# Patient Record
Sex: Female | Born: 1960 | Hispanic: Yes | Marital: Married | Smoking: Former smoker
Health system: Southern US, Community
[De-identification: ages and names within clinical notes are randomized; demographics above are authoritative.]

## PROBLEM LIST (undated history)

## (undated) DIAGNOSIS — R634 Abnormal weight loss: Secondary | ICD-10-CM

## (undated) DIAGNOSIS — R51 Headache: Secondary | ICD-10-CM

## (undated) DIAGNOSIS — Z87442 Personal history of urinary calculi: Secondary | ICD-10-CM

## (undated) DIAGNOSIS — R519 Headache, unspecified: Secondary | ICD-10-CM

## (undated) HISTORY — PX: URETER SURGERY: SHX823

---

## 1983-06-08 HISTORY — PX: BLADDER SURGERY: SHX569

## 2010-10-07 ENCOUNTER — Ambulatory Visit: Payer: Self-pay

## 2016-10-08 ENCOUNTER — Other Ambulatory Visit: Payer: Self-pay | Admitting: Internal Medicine

## 2016-10-08 DIAGNOSIS — Z1231 Encounter for screening mammogram for malignant neoplasm of breast: Secondary | ICD-10-CM

## 2016-10-13 ENCOUNTER — Other Ambulatory Visit: Payer: Self-pay | Admitting: Internal Medicine

## 2016-10-13 DIAGNOSIS — R310 Gross hematuria: Secondary | ICD-10-CM

## 2016-10-19 ENCOUNTER — Ambulatory Visit
Admission: RE | Admit: 2016-10-19 | Discharge: 2016-10-19 | Disposition: A | Discharge status: Home / Self Care | Payer: BLUE CROSS/BLUE SHIELD | Source: Ambulatory Visit | Attending: Internal Medicine | Admitting: Internal Medicine

## 2016-10-19 DIAGNOSIS — N1330 Unspecified hydronephrosis: Secondary | ICD-10-CM | POA: Insufficient documentation

## 2016-10-19 DIAGNOSIS — R310 Gross hematuria: Secondary | ICD-10-CM | POA: Diagnosis not present

## 2016-10-21 ENCOUNTER — Ambulatory Visit
Admission: RE | Admit: 2016-10-21 | Discharge: 2016-10-21 | Disposition: A | Discharge status: Home / Self Care | Payer: BLUE CROSS/BLUE SHIELD | Source: Ambulatory Visit | Attending: Internal Medicine | Admitting: Internal Medicine

## 2016-10-21 ENCOUNTER — Other Ambulatory Visit: Payer: Self-pay | Admitting: Internal Medicine

## 2016-10-21 DIAGNOSIS — Z1231 Encounter for screening mammogram for malignant neoplasm of breast: Secondary | ICD-10-CM | POA: Diagnosis not present

## 2016-11-29 ENCOUNTER — Encounter (INDEPENDENT_AMBULATORY_CARE_PROVIDER_SITE_OTHER): Payer: Self-pay | Admitting: Vascular Surgery

## 2016-11-29 ENCOUNTER — Ambulatory Visit (INDEPENDENT_AMBULATORY_CARE_PROVIDER_SITE_OTHER): Payer: BLUE CROSS/BLUE SHIELD | Admitting: Vascular Surgery

## 2016-11-29 DIAGNOSIS — I872 Venous insufficiency (chronic) (peripheral): Secondary | ICD-10-CM

## 2016-11-29 DIAGNOSIS — M79609 Pain in unspecified limb: Secondary | ICD-10-CM | POA: Insufficient documentation

## 2016-11-29 DIAGNOSIS — M79604 Pain in right leg: Secondary | ICD-10-CM | POA: Diagnosis not present

## 2016-11-29 DIAGNOSIS — I83813 Varicose veins of bilateral lower extremities with pain: Secondary | ICD-10-CM

## 2016-11-29 DIAGNOSIS — M79605 Pain in left leg: Secondary | ICD-10-CM | POA: Diagnosis not present

## 2016-11-30 ENCOUNTER — Ambulatory Visit (INDEPENDENT_AMBULATORY_CARE_PROVIDER_SITE_OTHER): Payer: BLUE CROSS/BLUE SHIELD | Admitting: Urology

## 2016-11-30 ENCOUNTER — Encounter: Payer: Self-pay | Admitting: Urology

## 2016-11-30 VITALS — BP 104/65 | HR 75 | Ht 61.0 in | Wt 112.9 lb

## 2016-11-30 DIAGNOSIS — R310 Gross hematuria: Secondary | ICD-10-CM

## 2016-11-30 DIAGNOSIS — N1330 Unspecified hydronephrosis: Secondary | ICD-10-CM | POA: Diagnosis not present

## 2016-11-30 LAB — URINALYSIS, COMPLETE
Glucose, UA: NEGATIVE
Ketones, UA: NEGATIVE
Leukocytes, UA: NEGATIVE
NITRITE UA: NEGATIVE
PH UA: 5.5 (ref 5.0–7.5)
Protein, UA: NEGATIVE
RBC UA: NEGATIVE
SPEC GRAV UA: 1.01 (ref 1.005–1.030)
UUROB: 0.2 mg/dL (ref 0.2–1.0)

## 2016-11-30 NOTE — Progress Notes (Signed)
11/30/2016 8:56 AM   Aimee Young [birth year] [id number]  Referring provider: Glendon Axe, MD [facility] [place], [place] [address]  Chief Complaint  Patient presents with  . Hydronephrosis    HPI: Referral for gross hematuria, suprapubic pain. Patient developed the symptoms May 9 where she complained of abdominal pain and blood in her urine. She also had dysuria. That had persisted for 2-3 days then cleared. She took Aleve and abx. Symptoms resolved. She never saw a stone pass. UA showed 4-10 red cells, 10-50 white cells, many bacteria. Urine culture grew Escherichia coli. She did undergo a renal ultrasound 10/19/2016 which revealed mild right hydronephrosis on the ultrasound although there were bilateral ureteral jets in the post void of 63 mL. I reviewed the images.  Exposures - she smoked for 5 years and then a few years and then stopped. 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   She has occasional nocturia and weak stream. She has a history of kidney stones and what sounds like an open ureterolithotomy in 1985 on the right. She has SP scar.   Modifying factors: There are no other modifying factors  Associated signs and symptoms: There are no other associated signs and symptoms Aggravating and relieving factors: There are no other aggravating or relieving factors Severity: Moderate Duration: Improved  UA today    PMH: No past medical history on file.  Surgical History: Past Surgical History:  Procedure Laterality Date  . BLADDER SURGERY    . URETER SURGERY Right     Home Medications:  Allergies as of 11/30/2016   No Known Allergies     Medication List       Accurate as of 11/30/16  8:56 AM. Always use your most recent med list.          BRAIN MIGHT/DHA & CO Q10 PO Take by mouth.   collagenase ointment Commonly known as:  SANTYL Apply 1 application topically daily.   HORSE CHESTNUT PO Take by mouth.     KIDNEY Caps Take by mouth.   levofloxacin 250 MG tablet Commonly known as:  LEVAQUIN   loteprednol 0.2 % Susp Commonly known as:  LOTEMAX Administer 1 drop into the left eye Two (2) times a day.   MULTIVITAMIN/FLUORIDE 1-0.3 MG Chew Chew by mouth.   vitamin B-12 100 MCG tablet Commonly known as:  CYANOCOBALAMIN Take 100 mcg by mouth daily.   Vitamin D3 5000 units Caps Take by mouth.       Allergies: No Known Allergies  Family History: Family History  Problem Relation Age of Onset  . Bladder Cancer Neg Hx   . Kidney cancer Neg Hx     Social History:  reports that she has never smoked. She has never used smokeless tobacco. She reports that she does not drink alcohol or use drugs.  ROS: UROLOGY Frequent Urination?: No Hard to postpone urination?: No Burning/pain with urination?: No Get up at night to urinate?: Yes Leakage of urine?: No Urine stream starts and stops?: No Trouble starting stream?: No Do you have to strain to urinate?: No Blood in urine?: Yes Urinary tract infection?: Yes Sexually transmitted disease?: No Injury to kidneys or bladder?: No Painful intercourse?: No Weak stream?: Yes Currently pregnant?: No Vaginal bleeding?: No Last menstrual period?: N  Gastrointestinal Nausea?: No Vomiting?: No Indigestion/heartburn?: Yes Diarrhea?: No Constipation?: No  Constitutional Fever: No Night sweats?: No Weight loss?: Yes Fatigue?: No  Skin Skin rash/lesions?: Yes Itching?:  Yes  Eyes Blurred vision?: Yes Double vision?: No  Ears/Nose/Throat Sore throat?: No Sinus problems?: No  Hematologic/Lymphatic Swollen glands?: No Easy bruising?: No  Cardiovascular Leg swelling?: No Chest pain?: No  Respiratory Cough?: No Shortness of breath?: No  Endocrine Excessive thirst?: No  Musculoskeletal Back pain?: No Joint pain?: Yes  Neurological Headaches?: No Dizziness?: No  Psychologic Depression?: No Anxiety?:  No  Physical Exam: BP 104/65 (BP Location: Left Arm, Patient Position: Sitting, Cuff Size: Normal)   Pulse 75   Ht 5\' 1"  (1.549 m)   Wt 51.2 kg (112 lb 14.4 oz)   BMI 21.33 kg/m   Constitutional:  Alert and oriented, No acute distress. Chaperone: Otto - interpreter  HEENT: [place] AT, moist mucus membranes.  Trachea midline, no masses. Cardiovascular: No clubbing, cyanosis, or edema. Respiratory: Normal respiratory effort, no increased work of breathing. GI: Abdomen is soft, nontender, nondistended, no abdominal masses GU: No CVA tenderness. Skin: No rashes, bruises or suspicious lesions. Lymph: No cervical or inguinal adenopathy. Neurologic: Grossly intact, no focal deficits, moving all 4 extremities. Psychiatric: Normal mood and affect.  Laboratory Data: No results found for: WBC, HGB, HCT, MCV, PLT  No results found for: CREATININE  No results found for: PSA  No results found for: TESTOSTERONE  No results found for: HGBA1C  Urinalysis No results found for: COLORURINE, APPEARANCEUR, LABSPEC, PHURINE, GLUCOSEU, HGBUR, BILIRUBINUR, KETONESUR, PROTEINUR, UROBILINOGEN, NITRITE, LEUKOCYTESUR  Pertinent Imaging: Renal US   Assessment & Plan:    1. Hydronephrosis, unspecified hydronephrosis type - may be related to prior surgery but give gross hematuria and exposure risk needs CT hematuria protocol. - Urinalysis, Complete  2. Gross hematuria - in addition to above - check cystoscopy.    No Follow-up on file.  Festus Aloe, [facility] [address], [place], [place] [address] [phone]

## 2016-12-01 NOTE — Progress Notes (Signed)
MRN : [id number]  Aimee Young is a 56 y.o. (1960-06-11) female who presents with chief complaint of  Chief Complaint  Patient presents with  . New Evaluation    Varicose veins  .  History of Present Illness: The patient is seen for evaluation of symptomatic varicose veins. The patient relates burning and stinging which worsened steadily throughout the course of the day, particularly with standing. The patient also notes an aching and throbbing pain over the varicosities, particularly with prolonged dependent positions. The symptoms are significantly improved with elevation.  The patient also notes that during hot weather the symptoms are greatly intensified. The patient states the pain from the varicose veins interferes with work, daily exercise, shopping and household maintenance. At this point, the symptoms are persistent and severe enough that they're having a negative impact on lifestyle and are interfering with daily activities.  There is no history of DVT, PE or superficial thrombophlebitis. There is no history of ulceration or hemorrhage. The patient denies a significant family history of varicose veins. OB history: P2  The patient has been wearing graduated compression for several years and this has not minimized her pain or prevented the progression of her varicose veins. At the present time the patient has not been using over-the-counter analgesics. There is no history of prior surgical intervention or sclerotherapy.    Current Meds  Medication Sig  . Cholecalciferol (VITAMIN D3) 5000 units CAPS Take by mouth.  . loteprednol (LOTEMAX) 0.2 % SUSP Administer 1 drop into the left eye Two (2) times a day.  Marland Kitchen Multi Vit-Fluoride-Folic Acid (MULTIVITAMIN/FLUORIDE) 1-0.3 MG CHEW Chew by mouth.    No past medical history on file.  Past Surgical History:  Procedure Laterality Date  . BLADDER SURGERY    . URETER SURGERY Right     Social History Social History  Substance Use  Topics  . Smoking status: Never Smoker  . Smokeless tobacco: Never Used  . Alcohol use No    Family History Family History  Problem Relation Age of Onset  . Bladder Cancer Neg Hx   . Kidney cancer Neg Hx   No family history of bleeding/clotting disorders, porphyria or autoimmune disease   No Known Allergies   REVIEW OF SYSTEMS (Negative unless checked)  Constitutional: [] Weight loss  [] Fever  [] Chills Cardiac: [] Chest pain   [] Chest pressure   [] Palpitations   [] Shortness of breath when laying flat   [] Shortness of breath with exertion. Vascular:  [] Pain in legs with walking   [x] Pain in legs with standing  [] History of DVT   [] Phlebitis   [x] Swelling in legs   [x] Varicose veins   [] Non-healing ulcers Pulmonary:   [] Uses home oxygen   [] Productive cough   [] Hemoptysis   [] Wheeze  [] COPD   [] Asthma Neurologic:  [] Dizziness   [] Seizures   [] History of stroke   [] History of TIA  [] Aphasia   [] Vissual changes   [] Weakness or numbness in arm   [] Weakness or numbness in leg Musculoskeletal:   [] Joint swelling   [] Joint pain   [] Low back pain Hematologic:  [] Easy bruising  [] Easy bleeding   [] Hypercoagulable state   [] Anemic Gastrointestinal:  [] Diarrhea   [] Vomiting  [] Gastroesophageal reflux/heartburn   [] Difficulty swallowing. Genitourinary:  [] Chronic kidney disease   [] Difficult urination  [] Frequent urination   [] Blood in urine Skin:  [] Rashes   [] Ulcers  Psychological:  [] History of anxiety   []  History of major depression.  Physical Examination  Vitals:   11/29/16 1523  BP: 113/74  Pulse: 63  Resp: 16  Weight: 52.2 kg (115 lb)  Height: 5\' 1"  (1.549 m)   Body mass index is 21.73 kg/m. Gen: WD/WN, NAD Head: [place]/AT, No temporalis wasting.  Ear/Nose/Throat: Hearing grossly intact, nares w/o erythema or drainage, poor dentition Eyes: PER, EOMI, sclera nonicteric.  Neck: Supple, no masses.  No bruit or JVD.  Pulmonary:  Good air movement, clear to auscultation bilaterally,  no use of accessory muscles.  Cardiac: RRR, normal S1, S2, no Murmurs. Vascular:  varicosities present diffusely greater than 5 mm bilaterally.  Mild venous stasis changes to the legs bilaterally.  Trace soft pitting edema Vessel Right Left  Radial Palpable Palpable  Ulnar Palpable Palpable  Brachial Palpable Palpable  Carotid Palpable Palpable  Femoral Palpable Palpable  Popliteal Palpable Palpable  PT Palpable Palpable  DP Palpable Palpable   Gastrointestinal: soft, non-distended. No guarding/no peritoneal signs.  Musculoskeletal: M/S 5/5 throughout.  No deformity or atrophy.  Neurologic: CN 2-12 intact. Pain and light touch intact in extremities.  Symmetrical.  Speech is fluent. Motor exam as listed above. Psychiatric: Judgment intact, Mood & affect appropriate for pt's clinical situation. Dermatologic: No rashes or ulcers noted.  No changes consistent with cellulitis. Lymph : No Cervical lymphadenopathy, no lichenification or skin changes of chronic lymphedema.  CBC No results found for: WBC, HGB, HCT, MCV, PLT  BMET No results found for: NA, K, CL, CO2, GLUCOSE, BUN, CREATININE, CALCIUM, GFRNONAA, GFRAA CrCl cannot be calculated (No order found.).  COAG No results found for: INR, PROTIME  Radiology No results found.  Assessment/Plan 1. Varicose veins of both lower extremities with pain  Recommend:  The patient has large symptomatic varicose veins that are painful and associated with swelling.  I have had a long discussion with the patient regarding  varicose veins and why they cause symptoms.  Patient will begin wearing graduated compression stockings class 1 on a daily basis, beginning first thing in the morning and removing them in the evening. The patient is instructed specifically not to sleep in the stockings.    The patient  will also begin using over-the-counter analgesics such as Motrin 600 mg po TID to help control the symptoms.    In addition, behavioral  modification including elevation during the day will be initiated.    Pending the results of these changes the  patient will be reevaluated in three months.   An  ultrasound of the venous system will be obtained.   Further plans will be based on the ultrasound results and whether conservative therapies are successful at eliminating the pain and swelling.   - VAS [place] LOWER EXTREMITY VENOUS REFLUX; Future  2. Chronic venous insufficiency See #1  3. Pain in both lower extremities See #1    Hortencia Pilar, MD  12/01/2016 2:09 AM

## 2016-12-15 ENCOUNTER — Ambulatory Visit: Payer: BLUE CROSS/BLUE SHIELD

## 2016-12-21 ENCOUNTER — Other Ambulatory Visit: Payer: BLUE CROSS/BLUE SHIELD | Admitting: Urology

## 2016-12-21 ENCOUNTER — Other Ambulatory Visit: Payer: Self-pay | Admitting: Urology

## 2016-12-21 ENCOUNTER — Telehealth: Payer: Self-pay | Admitting: Urology

## 2016-12-21 NOTE — Telephone Encounter (Signed)
Aimee Young was not seen today. We discussed with her she hadn't had a CT done and the importance of getting the CT given the prior US findings and f/u for exam and cystoscopy. She said she would get the CT done (although it might be in [place]) and f/u after for cystoscopy.

## 2016-12-23 ENCOUNTER — Telehealth: Payer: Self-pay | Admitting: Urology

## 2016-12-23 NOTE — Telephone Encounter (Signed)
Patient cancelled her CT appt due to the cost. She decided that she will go to [place] for treatment as it will be cheaper there.)  Thanks, Sharyn Lull

## 2017-02-28 ENCOUNTER — Encounter (INDEPENDENT_AMBULATORY_CARE_PROVIDER_SITE_OTHER): Payer: BLUE CROSS/BLUE SHIELD

## 2017-02-28 ENCOUNTER — Ambulatory Visit (INDEPENDENT_AMBULATORY_CARE_PROVIDER_SITE_OTHER): Payer: BLUE CROSS/BLUE SHIELD | Admitting: Vascular Surgery

## 2017-04-15 ENCOUNTER — Ambulatory Visit: Payer: BLUE CROSS/BLUE SHIELD | Admitting: Anesthesiology

## 2017-04-15 ENCOUNTER — Encounter
Admission: RE | Disposition: A | Discharge status: Home / Self Care | Payer: Self-pay | Source: Ambulatory Visit | Attending: Unknown Physician Specialty

## 2017-04-15 ENCOUNTER — Ambulatory Visit
Admission: RE | Admit: 2017-04-15 | Discharge: 2017-04-15 | Disposition: A | Discharge status: Home / Self Care | Payer: BLUE CROSS/BLUE SHIELD | Source: Ambulatory Visit | Attending: Unknown Physician Specialty | Admitting: Unknown Physician Specialty

## 2017-04-15 DIAGNOSIS — K640 First degree hemorrhoids: Secondary | ICD-10-CM | POA: Diagnosis not present

## 2017-04-15 DIAGNOSIS — D123 Benign neoplasm of transverse colon: Secondary | ICD-10-CM | POA: Diagnosis not present

## 2017-04-15 DIAGNOSIS — Z87891 Personal history of nicotine dependence: Secondary | ICD-10-CM | POA: Diagnosis not present

## 2017-04-15 DIAGNOSIS — Z79899 Other long term (current) drug therapy: Secondary | ICD-10-CM | POA: Diagnosis not present

## 2017-04-15 DIAGNOSIS — D120 Benign neoplasm of cecum: Secondary | ICD-10-CM | POA: Diagnosis not present

## 2017-04-15 DIAGNOSIS — Z1211 Encounter for screening for malignant neoplasm of colon: Secondary | ICD-10-CM | POA: Diagnosis present

## 2017-04-15 HISTORY — DX: Abnormal weight loss: R63.4

## 2017-04-15 HISTORY — PX: COLONOSCOPY WITH PROPOFOL: SHX5780

## 2017-04-15 SURGERY — COLONOSCOPY WITH PROPOFOL
Anesthesia: General

## 2017-04-15 MED ORDER — EPHEDRINE SULFATE 50 MG/ML IJ SOLN
INTRAMUSCULAR | Status: AC
  Filled 2017-04-15: qty 1

## 2017-04-15 MED ORDER — PROPOFOL 500 MG/50ML IV EMUL
INTRAVENOUS | Status: AC
  Filled 2017-04-15: qty 50

## 2017-04-15 MED ORDER — FENTANYL CITRATE (PF) 100 MCG/2ML IJ SOLN
INTRAMUSCULAR | Status: AC
  Filled 2017-04-15: qty 2

## 2017-04-15 MED ORDER — MIDAZOLAM HCL 2 MG/2ML IJ SOLN
INTRAMUSCULAR | Status: DC | PRN
Start: 2017-04-15 — End: 2017-04-15
  Administered 2017-04-15: 2 mg via INTRAVENOUS

## 2017-04-15 MED ORDER — EPHEDRINE SULFATE 50 MG/ML IJ SOLN
INTRAMUSCULAR | Status: DC | PRN
  Administered 2017-04-15: 10 mg via INTRAVENOUS

## 2017-04-15 MED ORDER — FENTANYL CITRATE (PF) 100 MCG/2ML IJ SOLN
INTRAMUSCULAR | Status: DC | PRN
  Administered 2017-04-15: 50 ug via INTRAVENOUS

## 2017-04-15 MED ORDER — PROPOFOL 500 MG/50ML IV EMUL
INTRAVENOUS | Status: DC | PRN
  Administered 2017-04-15: 120 ug/kg/min via INTRAVENOUS

## 2017-04-15 MED ORDER — SODIUM CHLORIDE 0.9 % IV SOLN
INTRAVENOUS | Status: DC | PRN
  Administered 2017-04-15: 09:00:00 via INTRAVENOUS

## 2017-04-15 MED ORDER — SODIUM CHLORIDE 0.9 % IV SOLN
INTRAVENOUS | Status: DC
  Administered 2017-04-15: 09:00:00 via INTRAVENOUS

## 2017-04-15 MED ORDER — MIDAZOLAM HCL 2 MG/2ML IJ SOLN
INTRAMUSCULAR | Status: AC
  Filled 2017-04-15: qty 2

## 2017-04-15 NOTE — H&P (Signed)
   Primary Care Physician:  Glendon Axe, MD Primary Gastroenterologist:  Dr. Vira Agar  Pre-Procedure History & Physical: HPI:  Aimee Young is a 56 y.o. female is here for an colonoscopy.   Past Medical History:  Diagnosis Date  . Weight loss     Past Surgical History:  Procedure Laterality Date  . BLADDER SURGERY    . URETER SURGERY Right     Prior to Admission medications   Medication Sig Start Date End Date Taking? Authorizing Provider  loteprednol (LOTEMAX) 0.2 % SUSP Administer 1 drop into the left eye Two (2) times a day.   Yes [provider]  Cholecalciferol (VITAMIN D3) 5000 units CAPS Take by mouth.    [provider]  collagenase (SANTYL) ointment Apply 1 application topically daily.    [provider]  HORSE CHESTNUT PO Take by mouth.    [provider]  levofloxacin (LEVAQUIN) 250 MG tablet  10/13/16   [provider]  Multi Vit-Fluoride-Folic Acid (MULTIVITAMIN/FLUORIDE) 1-0.3 MG CHEW Chew by mouth.    [provider]  Nutritional Supplements (KIDNEY) CAPS Take by mouth.    [provider]  Specialty Vitamins Products (BRAIN MIGHT/DHA & CO Q10 PO) Take by mouth.    [provider]  vitamin B-12 (CYANOCOBALAMIN) 100 MCG tablet Take 100 mcg by mouth daily.    [provider]    Allergies as of 01/14/2017  . (No Known Allergies)    Family History  Problem Relation Age of Onset  . Bladder Cancer Neg Hx   . Kidney cancer Neg Hx     Social History   Socioeconomic History  . Marital status: Married    Spouse name: Not on file  . Number of children: Not on file  . Years of education: Not on file  . Highest education level: Not on file  Social Needs  . Financial resource strain: Not on file  . Food insecurity - worry: Not on file  . Food insecurity - inability: Not on file  . Transportation needs - medical: Not on file  . Transportation needs - non-medical: Not on file   Occupational History  . Not on file  Tobacco Use  . Smoking status: Former Research scientist (life sciences)  . Smokeless tobacco: Never Used  Substance and Sexual Activity  . Alcohol use: Yes    Comment: occasional,none last 24hrs  . Drug use: No  . Sexual activity: Not on file  Other Topics Concern  . Not on file  Social History Narrative  . Not on file    Review of Systems: See HPI, otherwise negative ROS  Physical Exam: There were no vitals taken for this visit. General:   Alert,  pleasant and cooperative in NAD Head:  Normocephalic and atraumatic. Neck:  Supple; no masses or thyromegaly. Lungs:  Clear throughout to auscultation.    Heart:  Regular rate and rhythm. Abdomen:  Soft, nontender and nondistended. Normal bowel sounds, without guarding, and without rebound.   Neurologic:  Alert and  oriented x4;  grossly normal neurologically.  Impression/Plan: Aimee Young is here for an colonoscopy to be performed for colon cancer screening.  Risks, benefits, limitations, and alternatives regarding  colonoscopy have been reviewed with the patient.  Questions have been answered.  All parties agreeable.   Gaylyn Cheers, MD  04/15/2017, 8:59 AM

## 2017-04-15 NOTE — Op Note (Signed)
[facility]enter[facility] Patient Name: Aimee Young Procedure Date: 04/15/2017 8:46 AM MRN: [id number] Account #: [id number] Date of Birth: [birth year] Admit Type: Outpatient Age: 56 Room: [facility] ENDO ROOM 1 Gender: Female Note Status: Finalized Procedure:            Colonoscopy Indications:          Screening for colorectal malignant neoplasm Providers:            Manya Silvas, MD Referring MD:         Glendon Axe (Referring MD) Medicines:            Propofol per Anesthesia Complications:        No immediate complications. Procedure:            Pre-Anesthesia Assessment:                       - After reviewing the risks and benefits, the patient                        was deemed in satisfactory condition to undergo the                        procedure.                       After obtaining informed consent, the colonoscope was                        passed under direct vision. Throughout the procedure,                        the patient's blood pressure, pulse, and oxygen                        saturations were monitored continuously. The                        Colonoscope was introduced through the anus and                        advanced to the the cecum, identified by appendiceal                        orifice and ileocecal valve. The colonoscopy was                        performed without difficulty. The patient tolerated the                        procedure well. The quality of the bowel preparation                        was excellent. Findings:      Two sessile polyps were found,one in the hepatic flexure and one in the       cecum. The polyps were diminutive in size. These polyps were removed       with a jumbo cold forceps. Resection and retrieval were complete.      Internal hemorrhoids were found during endoscopy. The hemorrhoids were       small and Grade I (internal hemorrhoids that do not prolapse).  The exam was otherwise without  abnormality. Impression:           - Two diminutive polyps at the hepatic flexure and in                        the cecum, removed with a jumbo cold forceps. Resected                        and retrieved.                       - Internal hemorrhoids. Recommendation:       - Await pathology results. Manya Silvas, MD 04/15/2017 9:40:03 AM This report has been signed electronically. Number of Addenda: 0 Note Initiated On: 04/15/2017 8:46 AM Scope Withdrawal Time: 0 hours 8 minutes 54 seconds  Total Procedure Duration: 0 hours 18 minutes 25 seconds       [facility]

## 2017-04-15 NOTE — Anesthesia Postprocedure Evaluation (Signed)
Anesthesia Post Note  Patient: Designer, multimedia  Procedure(s) Performed: COLONOSCOPY WITH PROPOFOL (N/A )  Patient location during evaluation: Endoscopy Anesthesia Type: General Level of consciousness: awake and alert Pain management: pain level controlled Vital Signs Assessment: post-procedure vital signs reviewed and stable Respiratory status: spontaneous breathing, nonlabored ventilation, respiratory function stable and patient connected to nasal cannula oxygen Cardiovascular status: blood pressure returned to baseline and stable Postop Assessment: no apparent nausea or vomiting Anesthetic complications: no     Last Vitals:  Vitals:   04/15/17 0942 04/15/17 1011  BP: (!) 88/59 111/84  Pulse: 84   Resp: 12   Temp: (!) 36.1 C   SpO2: 99%     Last Pain:  Vitals:   04/15/17 0942  TempSrc: Tympanic                 Martha Clan

## 2017-04-15 NOTE — Anesthesia Post-op Follow-up Note (Signed)
Anesthesia QCDR form completed.        

## 2017-04-15 NOTE — Transfer of Care (Signed)
Immediate Anesthesia Transfer of Care Note  Patient: Aimee Young  Procedure(s) Performed: COLONOSCOPY WITH PROPOFOL (N/A )  Patient Location: PACU  Anesthesia Type:General  Level of Consciousness: awake and sedated  Airway & Oxygen Therapy: Patient Spontanous Breathing and Patient connected to nasal cannula oxygen  Post-op Assessment: Report given to RN and Post -op Vital signs reviewed and stable  Post vital signs: Reviewed and stable  Last Vitals:  Vitals:   04/15/17 0907  BP: 102/69  Pulse: 73  Resp: 20  Temp: (!) 35.8 C  SpO2: 100%    Last Pain:  Vitals:   04/15/17 0907  TempSrc: Tympanic         Complications: No apparent anesthesia complications

## 2017-04-15 NOTE — Anesthesia Procedure Notes (Signed)
Performed by: Cook-Martin, Daleigh Pollinger Pre-anesthesia Checklist: Patient identified, Emergency Drugs available, Suction available, Patient being monitored and Timeout performed Patient Re-evaluated:Patient Re-evaluated prior to induction Oxygen Delivery Method: Nasal cannula Preoxygenation: Pre-oxygenation with 100% oxygen Induction Type: IV induction Placement Confirmation: positive ETCO2 and CO2 detector       

## 2017-04-15 NOTE — Anesthesia Preprocedure Evaluation (Signed)
Anesthesia Evaluation  Patient identified by MRN, date of birth, ID band Patient awake    Reviewed: Allergy & Precautions, H&P , NPO status , Patient's Chart, lab work & pertinent test results, reviewed documented beta blocker date and time   History of Anesthesia Complications Negative for: history of anesthetic complications  Airway Mallampati: I  TM Distance: >3 FB Neck ROM: full    Dental  (+) Caps, Dental Advidsory Given, Missing, Teeth Intact   Pulmonary neg pulmonary ROS, former smoker,           Cardiovascular Exercise Tolerance: Good negative cardio ROS       Neuro/Psych negative neurological ROS  negative psych ROS   GI/Hepatic Neg liver ROS, GERD  ,  Endo/Other  diabetes (Pre-diabetic)  Renal/GU Renal disease (kidney stones)  negative genitourinary   Musculoskeletal   Abdominal   Peds  Hematology negative hematology ROS (+)   Anesthesia Other Findings Past Medical History: No date: Weight loss   Reproductive/Obstetrics negative OB ROS                             Anesthesia Physical Anesthesia Plan  ASA: II  Anesthesia Plan: General   Post-op Pain Management:    Induction: Intravenous  PONV Risk Score and Plan: 3 and Propofol infusion  Airway Management Planned: Nasal Cannula  Additional Equipment:   Intra-op Plan:   Post-operative Plan:   Informed Consent: I have reviewed the patients History and Physical, chart, labs and discussed the procedure including the risks, benefits and alternatives for the proposed anesthesia with the patient or authorized representative who has indicated his/her understanding and acceptance.   Dental Advisory Given  Plan Discussed with: Anesthesiologist, CRNA and Surgeon  Anesthesia Plan Comments:         Anesthesia Quick Evaluation

## 2017-04-18 ENCOUNTER — Encounter: Payer: Self-pay | Admitting: Unknown Physician Specialty

## 2017-04-18 LAB — SURGICAL PATHOLOGY

## 2017-10-12 ENCOUNTER — Other Ambulatory Visit: Payer: Self-pay | Admitting: Internal Medicine

## 2017-10-12 DIAGNOSIS — Z1231 Encounter for screening mammogram for malignant neoplasm of breast: Secondary | ICD-10-CM

## 2017-10-28 ENCOUNTER — Ambulatory Visit
Admission: RE | Admit: 2017-10-28 | Discharge: 2017-10-28 | Disposition: A | Discharge status: Home / Self Care | Payer: 59 | Source: Ambulatory Visit | Attending: Internal Medicine | Admitting: Internal Medicine

## 2017-10-28 ENCOUNTER — Other Ambulatory Visit: Payer: Self-pay | Admitting: Podiatry

## 2017-10-28 DIAGNOSIS — Z1231 Encounter for screening mammogram for malignant neoplasm of breast: Secondary | ICD-10-CM | POA: Diagnosis not present

## 2017-11-02 ENCOUNTER — Other Ambulatory Visit: Payer: Self-pay

## 2017-11-02 ENCOUNTER — Encounter
Admission: RE | Admit: 2017-11-02 | Discharge: 2017-11-02 | Disposition: A | Discharge status: Home / Self Care | Payer: 59 | Source: Ambulatory Visit | Attending: Podiatry | Admitting: Podiatry

## 2017-11-02 DIAGNOSIS — M2011 Hallux valgus (acquired), right foot: Secondary | ICD-10-CM | POA: Diagnosis not present

## 2017-11-02 DIAGNOSIS — M2041 Other hammer toe(s) (acquired), right foot: Secondary | ICD-10-CM | POA: Diagnosis not present

## 2017-11-02 DIAGNOSIS — I739 Peripheral vascular disease, unspecified: Secondary | ICD-10-CM | POA: Diagnosis not present

## 2017-11-02 DIAGNOSIS — Z87891 Personal history of nicotine dependence: Secondary | ICD-10-CM | POA: Diagnosis not present

## 2017-11-02 HISTORY — DX: Headache, unspecified: R51.9

## 2017-11-02 HISTORY — DX: Personal history of urinary calculi: Z87.442

## 2017-11-02 HISTORY — DX: Headache: R51

## 2017-11-02 NOTE — Pre-Procedure Instructions (Signed)
Labs                                                                                                                                                                                                                                                                  Component Name 10/10/2017 10/07/2016   4.9 5.3  4.25 4.37  13.1 13.7  40.5 39.9  95.3 91.3  30.8 31.4 (H)  32.3 34.3  12.9 13.3  10.4 10.2  0.01 0.01  WBC (White Blood Cell Count)  RBC (Red Blood Cell Count)  Hemoglobin  Hematocrit  MCV (Mean Corpuscular Volume)  MCH (Mean Corpuscular Hemoglobin)  MCHC (Mean Corpuscular Hemoglobin Concentration)  RDW-CV (Red Cell Distribution Width)  MPV (Mean Platelet Volume)  Immature Granulocyte Count   CBC w/auto Differential (5 Part) Component Name 10/10/2017 10/07/2016   178 188   0.2  Platelet Count  Immature Granulocyte %   CHEM PROFILE Component Name 10/10/2017 10/07/2016   87 89                                   141 142  3.8 3.8  106 105  28.9 26.0  15 14  0.7 0.8  87 75  9.2 9.5  24 26  17 17   72 75  4.3 4.5  1.0 1.1  6.7 7.2  1.8 1.7  Glucose  Sodium  Potassium  Chloride  Carbon Dioxide (CO2)  Urea Nitrogen (BUN)  Creatinine  Glomerular Filtration Rate (eGFR), MDRD Estimate  Calcium  AST   ALT   Alk Phos (alkaline Phosphatase)  Albumin  Bilirubin, Total  Protein, Total  A/G Ratio

## 2017-11-02 NOTE — Patient Instructions (Signed)
Your procedure is scheduled on:  Su procedimiento est programado para: Nov 04, 2017  Report to [place] a: [place] To find out your arrival time please call [phone] between [place] on  Para saber su hora de llegada por favor llame al ([place]: Nov 03, 2017   Remember: Instructions that are not followed completely may result in serious medical risk, up to and including death, or upon the discretion of your surgeon and anesthesiologist your surgery may need to be rescheduled.  Recuerde: Las instrucciones que no se siguen completamente Heritage manager en un riesgo de salud grave, incluyendo hasta la [place] o a discrecin de su cirujano y Environmental health practitioner, su ciruga se puede posponer.   __X_ 1.Do not eat food after midnight the night before your procedure. No   gum chewing or hard candies. You may drink clear liquids up to 2 hours   before you are scheduled to arrive for your surgery- DO not drink clear   liquids within 2 hours of the start of your surgery.     Clear Liquids include:    water, apple juice without pulp, clear carbohydrate drink such as    Clearfast of Gartorade, Black Coffee or Tea (Do not add anything to   coffee or tea).     XX No coma nada despus de la medianoche de la noche anterior a su procedimiento. No coma chicles ni caramelos duros. Puede tomar lquidos claros hasta 2 horas antes de su hora programada de llegada al hospital para su procedimiento. No tome lquidos claros durante el transcurso de las 2 horas de su llegada programada al hospital para su   procedimiento, ya que esto puede llevar a que su procedimiento se retrase o tenga que volver a Health and safety inspector.  Los lquidos claros incluyen:         - Agua o jugo de [place] sin pulpa         - Bebidas claras con carbohidratos como ClearFast o Gatorade         - Caf negro o t claro (sin leche, sin cremas, no agregue nada al caf ni al  t)  No tome nada que no est en  esta lista.  Los pacientes con diabetes tipo 1 y tipo 2 solo deben Agricultural engineer.  Llame a la clnica de PreCare o a la unidad de Same Day Surgery si tiene alguna pregunta sobre estas instrucciones.              _X__ 2.Do Not Smoke or use e-cigarettes For 24 Hours Prior to Your    Surgery.  Do not use any chewable tobacco products for at least 6   hours prior to surgery.   XX No fume ni use cigarrillos electrnicos durante las 24 horas previas a su [place].  No use ningn producto de tabaco masticable durante al menos 6 horas antes de la [place].     _X_X_ 3. No alcohol for 24 hours before or after surgery.    No tome alcohol durante las 24 horas antes ni despus de la [place].   ____4. Bring all medications with you on the day of surgery if instructed.    Lleve todos los medicamentos con usted el da de su ciruga si se le ha indicado as.   __XX__ 5. Notify your doctor if there is any change in your medical condition (cold, fever, infections).    Informe a su mdico si hay algn  cambio en su condicin mdica (resfriado, fiebre, infecciones).   Do not wear jewelry, make-up, hairpins, clips or nail polish.  No use joyas, maquillajes, pinzas/ganchos para el cabello ni esmalte de uas.  Do not wear lotions, powders, or perfumes. You may wear deodorant.  No use lociones, polvos o perfumes.  Puede usar desodorante.    Do not shave 48 hours prior to surgery. Men may shave face and neck.  No se afeite 48 horas antes de la [place].  Los hombres pueden [employer] cara  y el cuello.   Do not bring valuables to the hospital.   No lleve objetos [facility] is not responsible for any belongings or valuables.  [facility] no se hace responsable de ningn tipo de pertenencias u objetos de Geographical information systems officer.               Contacts, dentures or bridgework may not be worn into surgery.  Los lentes de [place], las dentaduras postizas o puentes no se pueden usar en la [place].    For patients  admitted to the hospital, discharge time is determined by your treatment team.  Para los pacientes que sean ingresados al hospital, el tiempo en el cual se le dar de alta es determinado por su equipo de [place].   Patients discharged the day of surgery will not be allowed to drive home. A los pacientes que se les da de alta el mismo da de la ciruga no se les permitir conducir a Holiday representative.   Please read over the following fact sheets that you were given: Por favor [place] informacin que le dieron:     ____ Take these medicines the morning of surgery with A SIP OF WATER:          [employer] estas medicinas la maana de la ciruga con [place]:  1. nada  2.   3.   4.       5.  6.     _XX___ Use CHG Soap as directed          Utilice el jabn de CHG segn lo indicado    _XX___ Stop Anti-inflammatories on           Deje de tomar antiinflamatorios el da:   __XX__ Stop supplements until after surgery            Deje de tomar suplementos hasta despus de la [place]

## 2017-11-03 ENCOUNTER — Other Ambulatory Visit: Payer: Self-pay | Admitting: Internal Medicine

## 2017-11-03 DIAGNOSIS — R921 Mammographic calcification found on diagnostic imaging of breast: Secondary | ICD-10-CM

## 2017-11-03 DIAGNOSIS — R928 Other abnormal and inconclusive findings on diagnostic imaging of breast: Secondary | ICD-10-CM

## 2017-11-04 ENCOUNTER — Ambulatory Visit: Payer: 59 | Admitting: Anesthesiology

## 2017-11-04 ENCOUNTER — Encounter
Admission: RE | Disposition: A | Discharge status: Home / Self Care | Payer: Self-pay | Source: Ambulatory Visit | Attending: Podiatry

## 2017-11-04 ENCOUNTER — Encounter: Payer: Self-pay | Admitting: Anesthesiology

## 2017-11-04 ENCOUNTER — Other Ambulatory Visit: Payer: Self-pay

## 2017-11-04 ENCOUNTER — Ambulatory Visit
Admission: RE | Admit: 2017-11-04 | Discharge: 2017-11-04 | Disposition: A | Discharge status: Home / Self Care | Payer: 59 | Source: Ambulatory Visit | Attending: Podiatry | Admitting: Podiatry

## 2017-11-04 DIAGNOSIS — Z87891 Personal history of nicotine dependence: Secondary | ICD-10-CM | POA: Insufficient documentation

## 2017-11-04 DIAGNOSIS — I739 Peripheral vascular disease, unspecified: Secondary | ICD-10-CM | POA: Insufficient documentation

## 2017-11-04 DIAGNOSIS — M2041 Other hammer toe(s) (acquired), right foot: Secondary | ICD-10-CM | POA: Insufficient documentation

## 2017-11-04 DIAGNOSIS — M2011 Hallux valgus (acquired), right foot: Secondary | ICD-10-CM | POA: Insufficient documentation

## 2017-11-04 HISTORY — PX: HALLUX VALGUS AUSTIN: SHX6623

## 2017-11-04 HISTORY — PX: HAMMER TOE SURGERY: SHX385

## 2017-11-04 SURGERY — CORRECTION, HALLUX VALGUS
Anesthesia: General | Site: Foot | Laterality: Right | Wound class: Clean

## 2017-11-04 MED ORDER — CEFAZOLIN SODIUM-DEXTROSE 2-4 GM/100ML-% IV SOLN
INTRAVENOUS | Status: AC
  Filled 2017-11-04: qty 100

## 2017-11-04 MED ORDER — BUPIVACAINE HCL (PF) 0.5 % IJ SOLN
INTRAMUSCULAR | Status: DC | PRN
  Administered 2017-11-04: 4 mL

## 2017-11-04 MED ORDER — PHENYLEPHRINE HCL 10 MG/ML IJ SOLN
INTRAMUSCULAR | Status: DC | PRN
  Administered 2017-11-04 (×2): 100 ug via INTRAVENOUS

## 2017-11-04 MED ORDER — FENTANYL CITRATE (PF) 100 MCG/2ML IJ SOLN
INTRAMUSCULAR | Status: AC
  Filled 2017-11-04: qty 2

## 2017-11-04 MED ORDER — POVIDONE-IODINE 7.5 % EX SOLN
Freq: Once | CUTANEOUS | Status: DC
  Filled 2017-11-04: qty 118

## 2017-11-04 MED ORDER — PROPOFOL 10 MG/ML IV BOLUS
INTRAVENOUS | Status: AC
  Filled 2017-11-04: qty 20

## 2017-11-04 MED ORDER — NEOMYCIN-POLYMYXIN B GU 40-200000 IR SOLN
Status: AC
  Filled 2017-11-04: qty 2

## 2017-11-04 MED ORDER — MIDAZOLAM HCL 2 MG/2ML IJ SOLN
INTRAMUSCULAR | Status: DC | PRN
  Administered 2017-11-04: 2 mg via INTRAVENOUS

## 2017-11-04 MED ORDER — PROPOFOL 10 MG/ML IV BOLUS
INTRAVENOUS | Status: DC | PRN
  Administered 2017-11-04: 120 mg via INTRAVENOUS

## 2017-11-04 MED ORDER — OXYCODONE HCL 5 MG PO TABS: 5.0000 mg | ORAL_TABLET | ORAL | 0 refills | Status: AC | PRN

## 2017-11-04 MED ORDER — CEFAZOLIN SODIUM-DEXTROSE 2-4 GM/100ML-% IV SOLN
2.0000 g | INTRAVENOUS | Status: AC
  Administered 2017-11-04: 2 g via INTRAVENOUS

## 2017-11-04 MED ORDER — LIDOCAINE HCL (CARDIAC) PF 100 MG/5ML IV SOSY
PREFILLED_SYRINGE | INTRAVENOUS | Status: DC | PRN
  Administered 2017-11-04: 100 mg via INTRAVENOUS

## 2017-11-04 MED ORDER — BUPIVACAINE HCL (PF) 0.5 % IJ SOLN
INTRAMUSCULAR | Status: AC
  Filled 2017-11-04: qty 30

## 2017-11-04 MED ORDER — DEXAMETHASONE SODIUM PHOSPHATE 10 MG/ML IJ SOLN
INTRAMUSCULAR | Status: DC | PRN
  Administered 2017-11-04: 6 mg via INTRAVENOUS

## 2017-11-04 MED ORDER — ONDANSETRON HCL 4 MG/2ML IJ SOLN
INTRAMUSCULAR | Status: DC | PRN
  Administered 2017-11-04: 4 mg via INTRAVENOUS

## 2017-11-04 MED ORDER — FAMOTIDINE 20 MG PO TABS
ORAL_TABLET | ORAL | Status: AC
  Administered 2017-11-04: 20 mg via ORAL
  Filled 2017-11-04: qty 1

## 2017-11-04 MED ORDER — MIDAZOLAM HCL 2 MG/2ML IJ SOLN
INTRAMUSCULAR | Status: AC
  Filled 2017-11-04: qty 2

## 2017-11-04 MED ORDER — FENTANYL CITRATE (PF) 100 MCG/2ML IJ SOLN
INTRAMUSCULAR | Status: DC | PRN
  Administered 2017-11-04 (×2): 25 ug via INTRAVENOUS
  Administered 2017-11-04: 50 ug via INTRAVENOUS

## 2017-11-04 MED ORDER — LACTATED RINGERS IV SOLN
INTRAVENOUS | Status: DC
  Administered 2017-11-04: 08:00:00 via INTRAVENOUS

## 2017-11-04 MED ORDER — FAMOTIDINE 20 MG PO TABS
20.0000 mg | ORAL_TABLET | Freq: Once | ORAL | Status: AC
  Administered 2017-11-04: 20 mg via ORAL

## 2017-11-04 SURGICAL SUPPLY — 59 items
1.1 K-wire, trocar, 100mm ×3 IMPLANT
BANDAGE ELASTIC 4 LF NS (GAUZE/BANDAGES/DRESSINGS) ×3 IMPLANT
BLADE DEBAKEY 8.0 (BLADE) ×2 IMPLANT
BLADE DEBAKEY 8.0MM (BLADE) ×1
BLADE MED AGGRESSIVE (BLADE) ×6 IMPLANT
BLADE MINI RND TIP GREEN BEAV (BLADE) ×3 IMPLANT
BLADE OSC/SAGITTAL MD 5.5X18 (BLADE) ×3 IMPLANT
BLADE SURG 15 STRL LF DISP TIS (BLADE) ×2 IMPLANT
BLADE SURG 15 STRL SS (BLADE) ×4
BNDG ESMARK 4X12 TAN STRL LF (GAUZE/BANDAGES/DRESSINGS) ×3 IMPLANT
CANISTER SUCT 1200ML W/VALVE (MISCELLANEOUS) ×3 IMPLANT
CLOSURE WOUND 1/4X4 (GAUZE/BANDAGES/DRESSINGS) ×1
CUFF TOURN 18 STER (MISCELLANEOUS) IMPLANT
CUFF TOURN DUAL PL 12 NO SLV (MISCELLANEOUS) ×3 IMPLANT
DRAPE FLUOR MINI C-ARM 54X84 (DRAPES) ×3 IMPLANT
DRAPE STERI IOBAN 125X83 (DRAPES) ×3 IMPLANT
DRSG TELFA 3X8 NADH (GAUZE/BANDAGES/DRESSINGS) IMPLANT
DURAPREP 26ML APPLICATOR (WOUND CARE) ×3 IMPLANT
ELECT REM PT RETURN 9FT ADLT (ELECTROSURGICAL) ×3
ELECTRODE REM PT RTRN 9FT ADLT (ELECTROSURGICAL) ×1 IMPLANT
GAUZE PETRO XEROFOAM 1X8 (MISCELLANEOUS) ×3 IMPLANT
GAUZE SPONGE 4X4 12PLY STRL (GAUZE/BANDAGES/DRESSINGS) ×3 IMPLANT
GAUZE STRETCH 2X75IN STRL (MISCELLANEOUS) ×6 IMPLANT
GLOVE BIO SURGEON STRL SZ7.5 (GLOVE) ×3 IMPLANT
GLOVE BIOGEL M STRL SZ7.5 (GLOVE) ×3 IMPLANT
GLOVE BIOGEL PI IND STRL 7.5 (GLOVE) ×1 IMPLANT
GLOVE BIOGEL PI INDICATOR 7.5 (GLOVE) ×2
GLOVE INDICATOR 8.0 STRL GRN (GLOVE) ×3 IMPLANT
GOWN STRL REUS W/ TWL LRG LVL3 (GOWN DISPOSABLE) ×2 IMPLANT
GOWN STRL REUS W/TWL LRG LVL3 (GOWN DISPOSABLE) ×4
K-WIRE 0.8X100 (WIRE) ×3
K-WIRE 1.1 (WIRE) ×2
K-WIRE FX100X1.1XTROC TIP (WIRE) ×1
KWIRE 0.8X100 (WIRE) ×1 IMPLANT
KWIRE FX100X1.1XTROC TIP (WIRE) ×1 IMPLANT
LABEL OR SOLS (LABEL) ×3 IMPLANT
NEEDLE FILTER BLUNT 18X 1/2SAF (NEEDLE) ×2
NEEDLE FILTER BLUNT 18X1 1/2 (NEEDLE) ×1 IMPLANT
NEEDLE HYPO 25X1 1.5 SAFETY (NEEDLE) ×6 IMPLANT
NS IRRIG 500ML POUR BTL (IV SOLUTION) ×3 IMPLANT
PACK EXTREMITY ARMC (MISCELLANEOUS) ×3 IMPLANT
PAD CAST CTTN 4X4 STRL (SOFTGOODS) ×1 IMPLANT
PADDING CAST COTTON 4X4 STRL (SOFTGOODS) ×2
PENCIL ELECTRO HAND CTR (MISCELLANEOUS) ×3 IMPLANT
RASP SM TEAR CROSS CUT (RASP) ×3 IMPLANT
SCREW COMP CANN 2.2X26MM (Screw) ×3 IMPLANT
SOL PREP PVP 2OZ (MISCELLANEOUS) ×3
SOLUTION PREP PVP 2OZ (MISCELLANEOUS) ×1 IMPLANT
SPLINT CAST 1 STEP 4X30 (MISCELLANEOUS) ×3 IMPLANT
SPLINT FAST PLASTER 5X30 (CAST SUPPLIES) ×2
SPLINT PLASTER CAST FAST 5X30 (CAST SUPPLIES) ×1 IMPLANT
STOCKINETTE STRL 6IN 960660 (GAUZE/BANDAGES/DRESSINGS) ×3 IMPLANT
STRAP SAFETY 5IN WIDE (MISCELLANEOUS) ×3 IMPLANT
STRIP CLOSURE SKIN 1/4X4 (GAUZE/BANDAGES/DRESSINGS) ×2 IMPLANT
SUT VIC AB 4-0 FS2 27 (SUTURE) ×6 IMPLANT
SWABSTK COMLB BENZOIN TINCTURE (MISCELLANEOUS) ×3 IMPLANT
SYR 10ML LL (SYRINGE) ×3 IMPLANT
WIRE Z .045 C-WIRE SPADE TIP (WIRE) ×6 IMPLANT
WIRE Z .062 C-WIRE SPADE TIP (WIRE) ×3 IMPLANT

## 2017-11-04 NOTE — Op Note (Signed)
Date of operation: 11/04/2017.  Surgeon: Durward Fortes D.P.M.  Preoperative diagnosis: #1 hallux valgus deformity right foot. 2.  Hammertoe deformity right second toe.  Postoperative diagnosis: Same.  Procedures: 1.  [place] type hallux valgus correction right foot. 2.  Arthroplasty right second toe.  Anesthesia: LMA with local.  Hemostasis: Pneumatic tourniquet right ankle 250 mmHg.  Estimated blood loss: Less than 5 cc.  Materials: 26 mm, 2.2 mm Medartis compression screw.              One 0.045 inch K wire.  Pathology: None.  Complications none apparent.    Operative indications: This is a 57 year old female with a chief complaint of a chronic painful bunion on her right foot.  Elects to proceed with surgical correction of the deformity as well as a painful hammertoe on her right second toe.   Operative procedure: Patient was taken to the operating room and placed on the table in supine position.  Following satisfactory LMA anesthesia the right foot was anesthetized with 11 cc of 0.5% bupivacaine plain around the first metatarsal and base of the second toe.  Pneumatic tourniquet applied at the level of the right ankle and the foot was prepped and draped in the usual sterile fashion.  The foot was exsanguinated and the tourniquet inflated to 250 mmHg.      Attention was then directed to the dorsal aspect of the right foot where an approximate 5 cm linear incision was made coursing proximal to distal centered over the first metatarsal and metatarsophalangeal joint.  The incision was deepened via sharp and blunt dissection with care taken to cauterize bleeders and retract neurovascular structures.  At the level of the joint a linear capsulotomy was performed and the capsular and periosteal tissues were reflected off of the medial and dorsal head of the first metatarsal.  Some hypertrophied bone along medial eminence with some cartilage loss along the dorsal medial corner.  Remainder of the  cartilage appeared intact.  Using a sagittal saw the medial eminence was resected and a wire was driven from medial to lateral as an axis guide.  Using a sagittal saw a V-shaped osteotomy was then performed from medial to lateral through the head of the first metatarsal and the capital fragment freely mobilized.  Pin was removed.     Attention was directed to the lateral aspect of the incision where this was deepened in the first interspace down to the intermetatarsal ligament which was incised.  Adductor tendon was identified and freed from the lateral aspect of the joint.  Lateral capsulotomy with freeing of the sesamoid apparatus was performed.  Noted to be good release of the contracture on the great toe with mobilization of the fragment distally.  Attention was directed back medially where the capital fragment was transposed laterally and fixated using a wire from the Medartis screw set.  Intraoperative FluoroScan views revealed good alignment and wire placement.  A 26 mm 2.2 Medartis compression screw was then inserted under standard technique.  The medial eminence was resected using a sagittal saw and the edges were rasped smooth.  The wound was flushed with copious amounts of sterile saline and closed using 4-0 Vicryl running suture for all layers from periosteal and capsular closure to deep and superficial subcutaneous to skin closure.    Attention was then directed to the dorsal aspect of the second toe where an approximate 2 cm linear incision was made coursing proximal to distal centered over the proximal interphalangeal joint.  Incision deepened down to the level of the joint where a transverse tenotomy was performed and the capsular and periosteal tissues were reflected off of the head of the proximal phalanx.  The head was then resected using a sagittal saw and removed in toto.  The wound was flushed with copious amounts of sterile saline and a 0.045 inch K wire was then driven through the middle  and distal phalanx to the tip of the toe and then retrograded back through the proximal phalanx.  Intraoperative FluoroScan views revealed good alignment of the second toe.  The wound was then flushed again with copious amounts of sterile saline and the tendon reapproximated with 4-0 Vicryl simple interrupted suture followed by skin closure using 5-0 nylon simple interrupted suture.  Tincture of benzoin and Steri-Strips applied to the first metatarsal incision followed by Xeroform and a sterile bandage.  Tourniquet released and blood flow noted return immediately to the right foot and all digits.  A posterior splint was then applied to the right lower extremity with the foot 90 degrees relative to the leg.  Patient was awakened and transported to the PACU with vital signs stable in good condition.

## 2017-11-04 NOTE — Anesthesia Procedure Notes (Signed)
Procedure Name: LMA Insertion Date/Time: 11/04/2017 8:54 AM Performed by: Philbert Riser, CRNA Pre-anesthesia Checklist: Patient identified, Emergency Drugs available, Suction available and Patient being monitored Patient Re-evaluated:Patient Re-evaluated prior to induction Oxygen Delivery Method: Circle system utilized Preoxygenation: Pre-oxygenation with 100% oxygen Induction Type: IV induction Ventilation: Mask ventilation without difficulty LMA: LMA inserted LMA Size: 4.0 Number of attempts: 1

## 2017-11-04 NOTE — Transfer of Care (Signed)
Immediate Anesthesia Transfer of Care Note  Patient: Aimee Young  Procedure(s) Performed: HALLUX VALGUS AUSTIN/right foot (Right Foot) HAMMER TOE CORRECTION/rt 2nd toe (Right Foot)  Patient Location: PACU  Anesthesia Type:General  Level of Consciousness: awake and oriented  Airway & Oxygen Therapy: Patient Spontanous Breathing and Patient connected to face mask oxygen  Post-op Assessment: Report given to RN and Post -op Vital signs reviewed and stable  Post vital signs: Reviewed and stable  Last Vitals:  Vitals Value Taken Time  BP 112/75 11/04/2017 10:50 AM  Temp 36.7 C 11/04/2017 10:50 AM  Pulse 66 11/04/2017 10:51 AM  Resp 8 11/04/2017 10:51 AM  SpO2 100 % 11/04/2017 10:51 AM  Vitals shown include unvalidated device data.  Last Pain:  Vitals:   11/04/17 0806  TempSrc: Temporal  PainSc: 5          Complications: No apparent anesthesia complications

## 2017-11-04 NOTE — Anesthesia Preprocedure Evaluation (Signed)
Anesthesia Evaluation  Patient identified by MRN, date of birth, ID band Patient awake    Reviewed: Allergy & Precautions, NPO status , Patient's Chart, lab work & pertinent test results, reviewed documented beta blocker date and time   Airway Mallampati: II  TM Distance: >3 FB     Dental  (+) Chipped   Pulmonary former smoker,           Cardiovascular + Peripheral Vascular Disease       Neuro/Psych  Headaches,    GI/Hepatic   Endo/Other    Renal/GU      Musculoskeletal   Abdominal   Peds  Hematology   Anesthesia Other Findings   Reproductive/Obstetrics                             Anesthesia Physical Anesthesia Plan  ASA: III  Anesthesia Plan: General   Post-op Pain Management:    Induction: Intravenous  PONV Risk Score and Plan:   Airway Management Planned: LMA  Additional Equipment:   Intra-op Plan:   Post-operative Plan:   Informed Consent: I have reviewed the patients History and Physical, chart, labs and discussed the procedure including the risks, benefits and alternatives for the proposed anesthesia with the patient or authorized representative who has indicated his/her understanding and acceptance.     Plan Discussed with: CRNA  Anesthesia Plan Comments:         Anesthesia Quick Evaluation

## 2017-11-04 NOTE — Discharge Instructions (Addendum)
1.  Elevate right lower extremity.  2.  Keep the bandage on the right foot clean, dry, and do not remove.  3.  Sponge bathe only right lower extremity.  4.  Wear surgical shoe and splint on the right foot whenever walking or standing.  5.  Take 1 pain pill, oxycodone, every 4 hours if needed for pain.    AMBULATORY SURGERY  DISCHARGE INSTRUCTIONS   1) The drugs that you were given will stay in your system until tomorrow so for the next 24 hours you should not:  A) Drive an automobile B) Make any legal decisions C) Drink any alcoholic beverage   2) You may resume regular meals tomorrow.  Today it is better to start with liquids and gradually work up to solid foods.  You may eat anything you prefer, but it is better to start with liquids, then soup and crackers, and gradually work up to solid foods.   3) Please notify your doctor immediately if you have any unusual bleeding, trouble breathing, redness and pain at the surgery site, drainage, fever, or pain not relieved by medication.    4) Additional Instructions:        Please contact your physician with any problems or Same Day Surgery at [phone], Monday through Friday 6 am to 4 pm, or [place] at [place] number at [phone].

## 2017-11-04 NOTE — Anesthesia Post-op Follow-up Note (Signed)
Anesthesia QCDR form completed.        

## 2017-11-04 NOTE — Interval H&P Note (Signed)
History and Physical Interval Note:  11/04/2017 8:24 AM  Aimee Young  has presented today for surgery, with the diagnosis of Hallux Valgus rt foot/M20.11;Hammertoe rt 2nd/M20.41  The various methods of treatment have been discussed with the patient and family. After consideration of risks, benefits and other options for treatment, the patient has consented to  Procedure(s): HALLUX VALGUS AUSTIN/right foot (Right) HAMMER TOE CORRECTION/rt 2nd toe (Right) as a surgical intervention .  The patient's history has been reviewed, patient examined, no change in status, stable for surgery.  I have reviewed the patient's chart and labs.  Questions were answered to the patient's satisfaction.     Durward Fortes

## 2017-11-04 NOTE — Anesthesia Postprocedure Evaluation (Signed)
Anesthesia Post Note  Patient: Designer, multimedia  Procedure(s) Performed: HALLUX VALGUS AUSTIN/right foot (Right Foot) HAMMER TOE CORRECTION/rt 2nd toe (Right Foot)  Patient location during evaluation: PACU Anesthesia Type: General Level of consciousness: awake and alert Pain management: pain level controlled Vital Signs Assessment: post-procedure vital signs reviewed and stable Respiratory status: spontaneous breathing, nonlabored ventilation, respiratory function stable and patient connected to nasal cannula oxygen Cardiovascular status: blood pressure returned to baseline and stable Postop Assessment: no apparent nausea or vomiting Anesthetic complications: no     Last Vitals:  Vitals:   11/04/17 1143 11/04/17 1215  BP: 136/74 116/73  Pulse: 72 (!) 59  Resp: 14 16  Temp: (!) 36.1 C (!) 36.2 C  SpO2: 100% 100%    Last Pain:  Vitals:   11/04/17 1215  TempSrc: Temporal  PainSc: [place]

## 2017-12-27 IMAGING — US US RENAL
1 series · 14 of 25 positions shown · non-contrast
Comparison: None.

CLINICAL DATA: Gross hematuria 10 days ago visualized once.

EXAM:
RENAL / URINARY TRACT ULTRASOUND COMPLETE

[Series 1: us renal · 0.22mm/px · 14 of 48 slices shown]
[im 1/48]
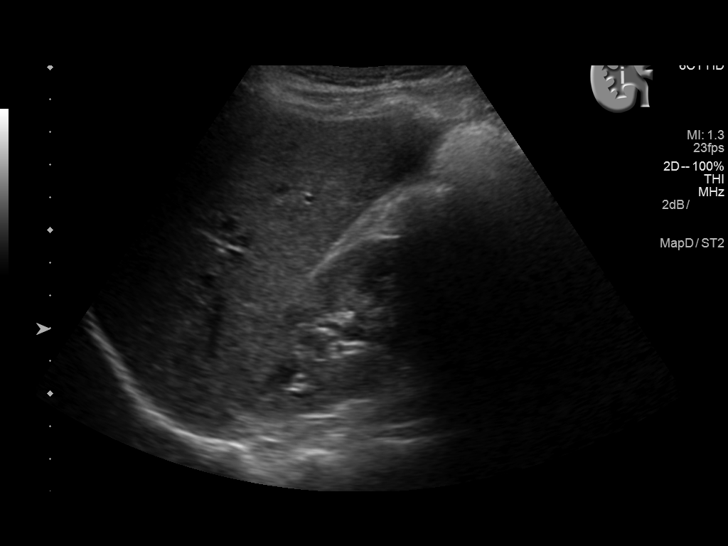
[im 4/48]
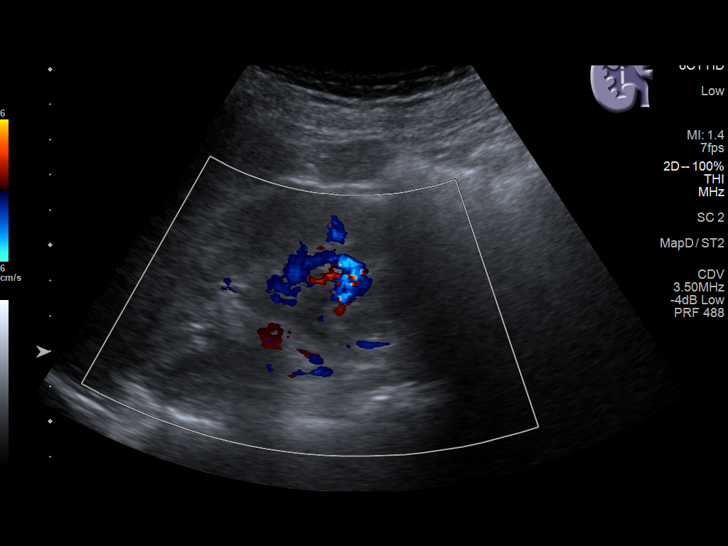
[im 8/48]
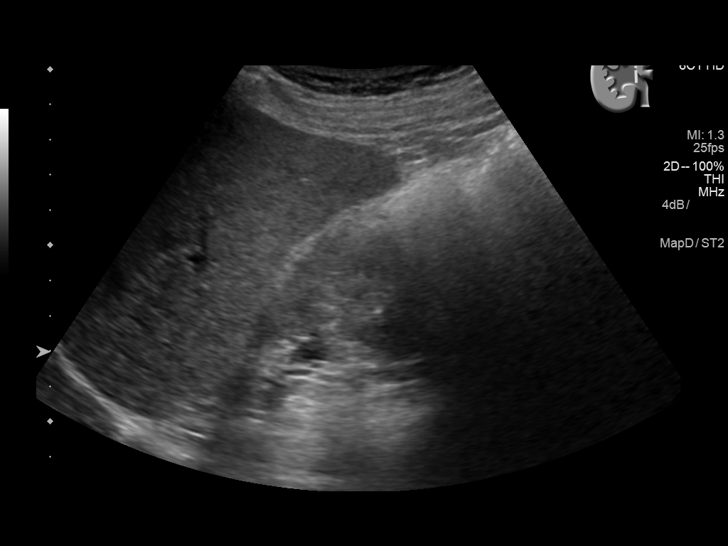
[im 12/48]
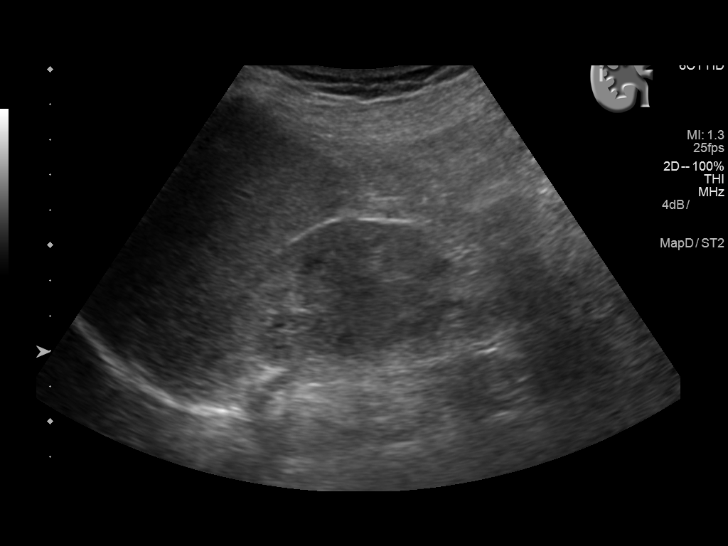
[im 16/48]
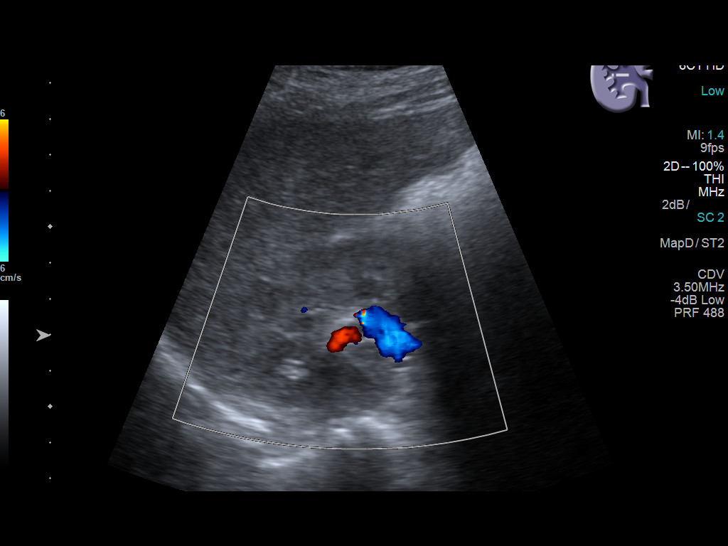
[im 18/48]
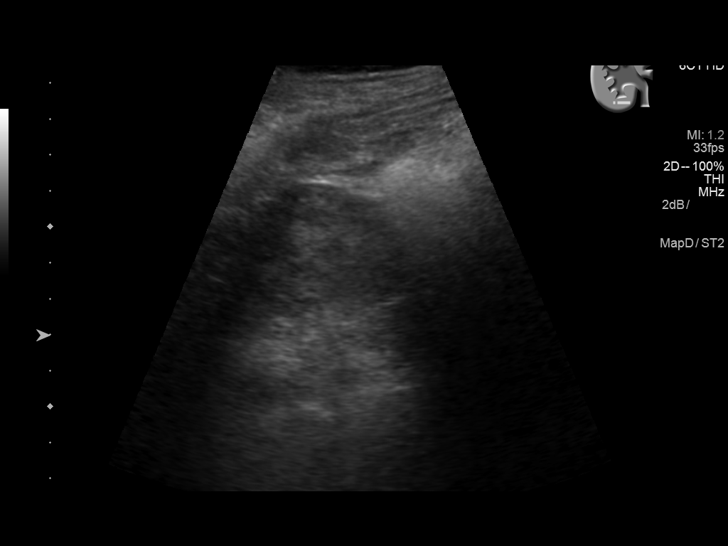
[im 22/48]
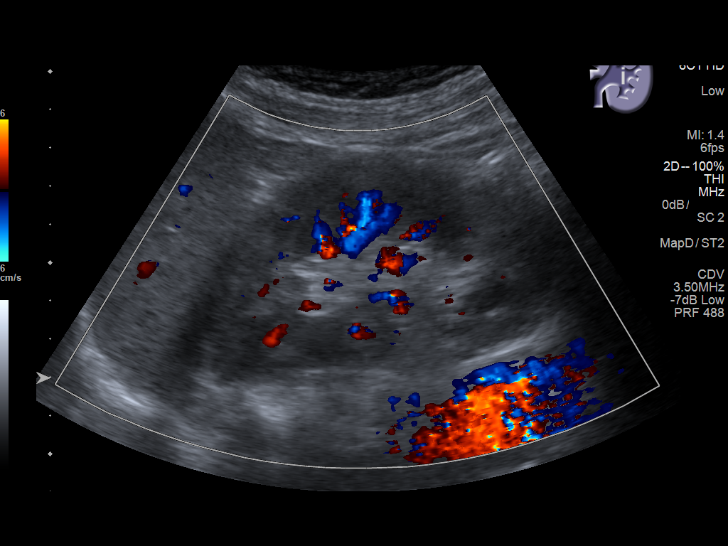
[im 26/48]
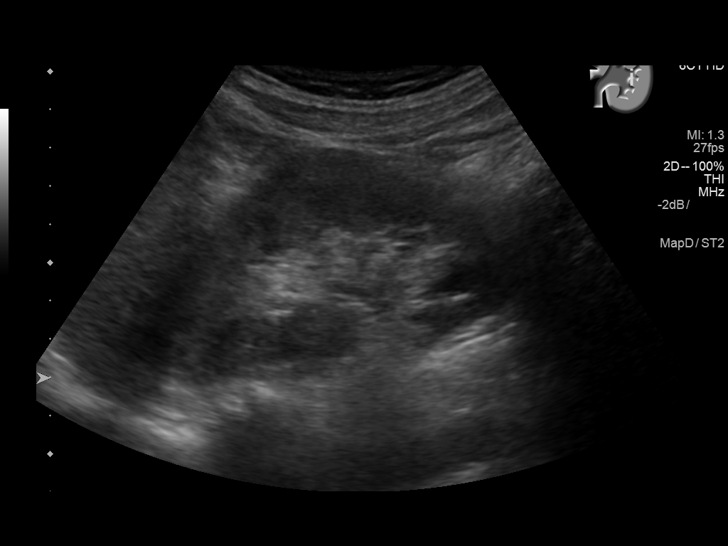
[im 30/48]
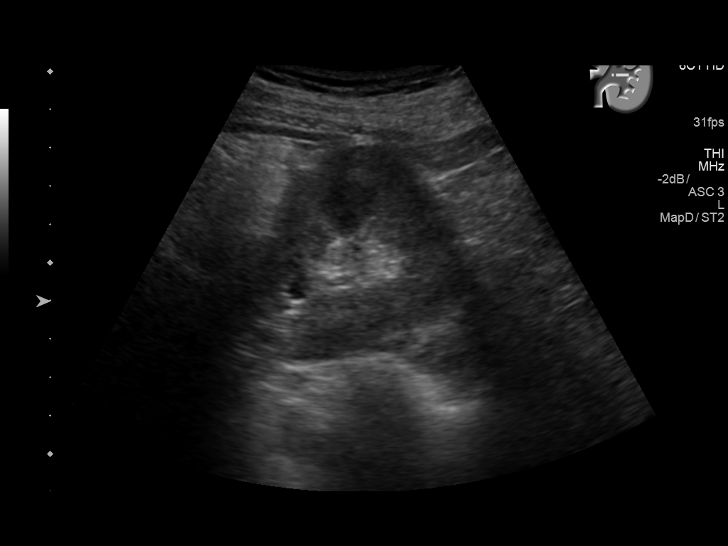
[im 32/48]
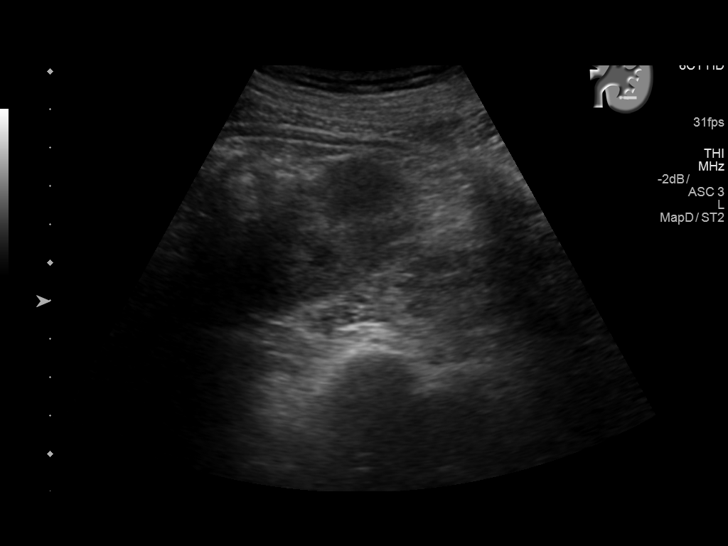
[im 36/48]
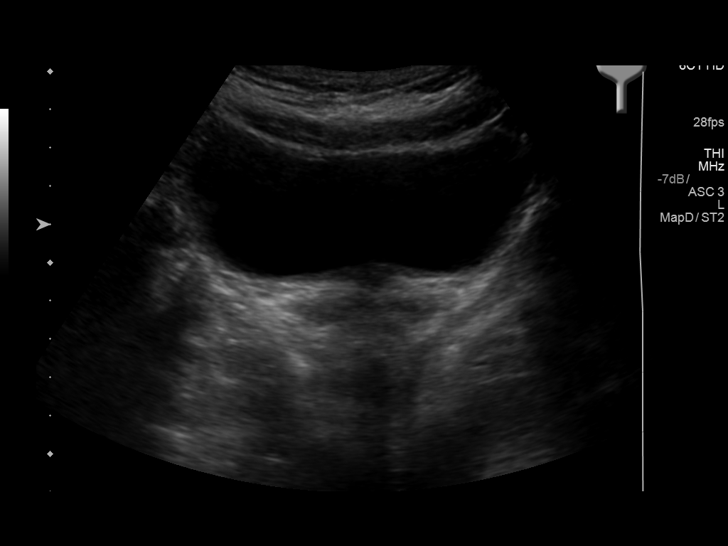
[im 40/48]
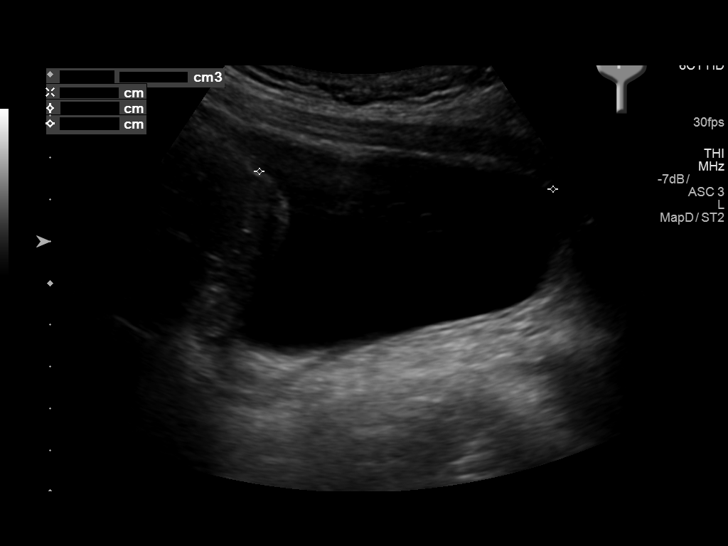
[im 44/48]
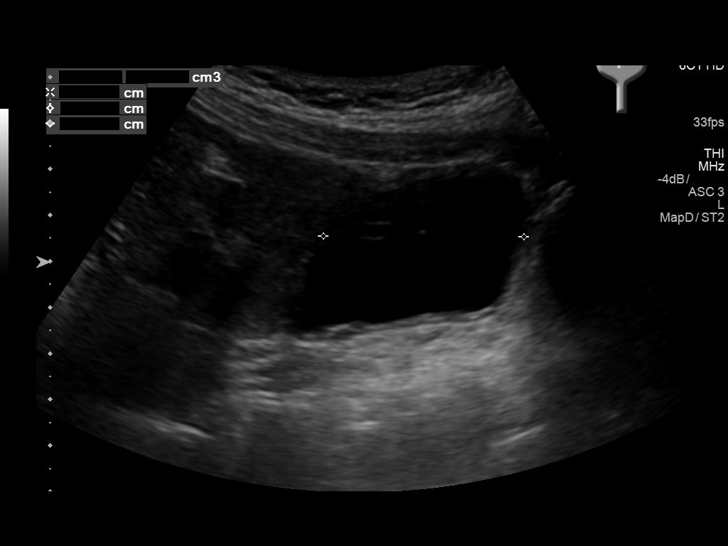
[im 48/48]
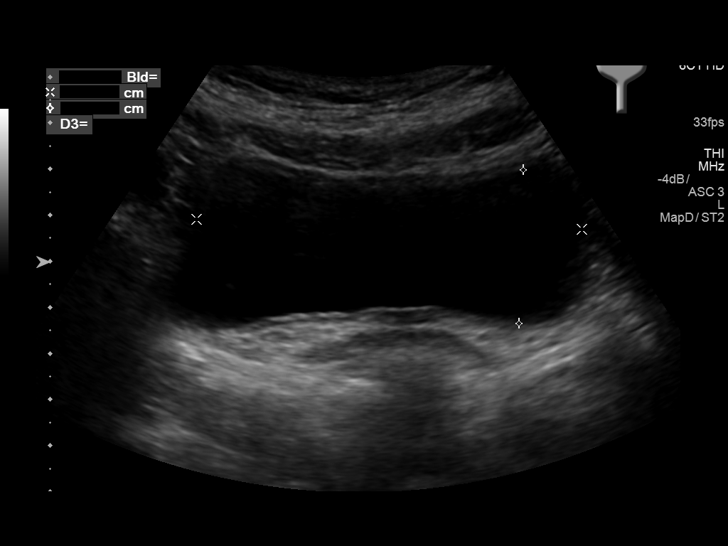

[14 of 25 positions shown; findings below may reference images not displayed]

FINDINGS: Right Kidney:

Length: 9 cm.  Mild hydronephrosis.

Left Kidney:

Length: 9.7 cm. Echogenicity within normal limits. No mass or
hydronephrosis visualized.

Bladder:

The prevoid volume is 188 cc. The postvoid volume is 63 cc. No
masses identified. Bilateral ureteral jets are seen.
IMPRESSION: 1. Mild right-sided hydronephrosis. The chronicity of this finding
is unclear.
2. Mild postvoid residual.
3. No cause for hematuria identified. CT imaging would be ch more
sensitive in the evaluation for masses or stones if concern
persists.

## 2018-03-09 ENCOUNTER — Telehealth: Payer: Self-pay | Admitting: Urology

## 2018-03-09 NOTE — Telephone Encounter (Signed)
Patient cancelled her CT appt due to the cost. She decided that she will go to [place] for treatment as it will be cheaper there.)   michelle

## 2018-03-09 NOTE — Telephone Encounter (Signed)
-----   Message from Festus Aloe, MD sent at 03/08/2018  5:25 PM EDT ----- Notify patient she is overdue for follow-up of blood in her urine and swelling in her kidney. Thanks.   ----- Message ----- From: SYSTEM Sent: 03/07/2018  12:08 AM EDT To: Festus Aloe, MD

## 2019-09-07 ENCOUNTER — Ambulatory Visit: Payer: Self-pay | Attending: Internal Medicine

## 2019-09-07 DIAGNOSIS — Z23 Encounter for immunization: Secondary | ICD-10-CM

## 2019-09-07 NOTE — Progress Notes (Signed)
   Covid-19 Vaccination Clinic  Name:  [name]    MRN: [id number] DOB: [birth year]  09/07/2019  [name] was observed post Covid-19 immunization for 15 minutes without incident. She was provided with Vaccine Information Sheet and instruction to access the V-Safe system.   [name] was instructed to call 911 with any severe reactions post vaccine: [place] Kitchen Difficulty breathing  . Swelling of face and throat  . A fast heartbeat  . A bad rash all over body  . Dizziness and weakness   Immunizations Administered    Name Date Dose VIS Date Route   Pfizer COVID-19 Vaccine 09/07/2019  3:47 PM 0.3 mL 05/18/2019 Intramuscular   Manufacturer: [place]   Lot: [id number]   [place]: [id number]

## 2019-09-28 ENCOUNTER — Ambulatory Visit: Payer: Self-pay | Attending: Internal Medicine

## 2019-09-28 DIAGNOSIS — Z23 Encounter for immunization: Secondary | ICD-10-CM

## 2019-09-28 NOTE — Progress Notes (Signed)
   Covid-19 Vaccination Clinic  Name:  Aimee Young    MRN: [id number] DOB: 1960-10-17  09/28/2019  [name] was observed post Covid-19 immunization for 15 minutes without incident. She was provided with Vaccine Information Sheet and instruction to access the V-Safe system.   [name] was instructed to call 911 with any severe reactions post vaccine: Marland Kitchen Difficulty breathing  . Swelling of face and throat  . A fast heartbeat  . A bad rash all over body  . Dizziness and weakness   Immunizations Administered    Name Date Dose VIS Date Route   Pfizer COVID-19 Vaccine 09/28/2019  3:34 PM 0.3 mL 08/01/2018 Intramuscular   Manufacturer: [employer]   Lot: [id number]   [place]: [id number]
